# Patient Record
Sex: Male | Born: 1970 | Race: White | Hispanic: No | Marital: Married | Smoking: Never smoker
Health system: Southern US, Community
[De-identification: ages and names within clinical notes are randomized; demographics above are authoritative.]

---

## 2000-12-05 ENCOUNTER — Emergency Department (HOSPITAL_COMMUNITY): Admission: EM | Admit: 2000-12-05 | Discharge: 2000-12-05 | Payer: Self-pay | Admitting: Emergency Medicine

## 2000-12-06 ENCOUNTER — Encounter: Payer: Self-pay | Admitting: Emergency Medicine

## 2015-03-18 ENCOUNTER — Encounter (HOSPITAL_BASED_OUTPATIENT_CLINIC_OR_DEPARTMENT_OTHER): Payer: Self-pay | Admitting: *Deleted

## 2015-03-18 ENCOUNTER — Emergency Department (HOSPITAL_BASED_OUTPATIENT_CLINIC_OR_DEPARTMENT_OTHER): Payer: BLUE CROSS/BLUE SHIELD

## 2015-03-18 ENCOUNTER — Emergency Department (HOSPITAL_BASED_OUTPATIENT_CLINIC_OR_DEPARTMENT_OTHER)
Admission: EM | Admit: 2015-03-18 | Discharge: 2015-03-18 | Disposition: A | Discharge status: Home / Self Care | Payer: BLUE CROSS/BLUE SHIELD | Attending: Emergency Medicine | Admitting: Emergency Medicine

## 2015-03-18 DIAGNOSIS — K122 Cellulitis and abscess of mouth: Secondary | ICD-10-CM | POA: Diagnosis not present

## 2015-03-18 DIAGNOSIS — M5382 Other specified dorsopathies, cervical region: Secondary | ICD-10-CM | POA: Diagnosis not present

## 2015-03-18 DIAGNOSIS — R221 Localized swelling, mass and lump, neck: Secondary | ICD-10-CM

## 2015-03-18 DIAGNOSIS — R1310 Dysphagia, unspecified: Secondary | ICD-10-CM | POA: Diagnosis present

## 2015-03-18 MED ORDER — PREDNISONE 10 MG PO TABS: 40.0000 mg | ORAL_TABLET | Freq: Every day | ORAL | Status: AC

## 2015-03-18 MED ORDER — PREDNISONE 20 MG PO TABS
40.0000 mg | ORAL_TABLET | Freq: Once | ORAL | Status: AC
  Administered 2015-03-18: 40 mg via ORAL
  Filled 2015-03-18: qty 2

## 2015-03-18 MED ORDER — IOHEXOL 300 MG/ML  SOLN
75.0000 mL | Freq: Once | INTRAMUSCULAR | Status: AC | PRN
  Administered 2015-03-18: 75 mL via INTRAVENOUS

## 2015-03-18 NOTE — ED Notes (Signed)
States his throat is swelling, no changes in speech, no difficulty swallowing, denies any fever

## 2015-03-18 NOTE — ED Notes (Signed)
Pt states that this issue resolves itself after a few hours.

## 2015-03-18 NOTE — ED Notes (Signed)
Patient transported to CT 

## 2015-03-18 NOTE — ED Provider Notes (Signed)
CSN: [id number]     Arrival date & time 03/18/15  5784 History   First MD Initiated Contact with Patient 03/18/15 [phone]     No chief complaint on file.  Chief complaint fullness in throat  (Consider location/radiation/quality/duration/timing/severity/associated sxs/prior Treatment) HPI Patient awakened this morning with feeling of his throat closing. His wife states that his voice is slightly different. He denies fever denies pain anywhere denies dyspnea. Patient is afraid he has sleep apnea his wife states he snores a lot. He's had similar symptoms intermittently for the past 6 months. He awakens with feeling of his throat closing and symptoms improved spontaneously throughout the day without treatment. This episode more severe than past episodes. Nothing makes symptoms better or worse. No other associated symptoms. No past medical history on file. No past surgical history on file. No family history on file. History  Substance Use Topics  . Smoking status: Not on file  . Smokeless tobacco: Not on file  . Alcohol Use: Not on file   no tobacco no alcohol no drugs  Review of Systems  Constitutional: Negative.   HENT: Positive for voice change.        Fullness in throat  Respiratory: Negative.   Cardiovascular: Negative.   Gastrointestinal: Negative.   Musculoskeletal: Negative.   Skin: Negative.   Allergic/Immunologic: Negative.   Neurological: Negative.   Psychiatric/Behavioral: Negative.   All other systems reviewed and are negative.     Allergies  Review of patient's allergies indicates not on file.  Home Medications   Prior to Admission medications   Not on File   BP 124/90 mmHg  Pulse 66  Temp(Src) 98 F (36.7 C) (Oral)  Resp 18  Ht 6' (1.829 m)  Wt 205 lb (92.987 kg)  BMI 27.80 kg/m2  SpO2 97% Physical Exam  Constitutional: He appears well-developed and well-nourished.  HENT:  Head: Normocephalic and atraumatic.  Mouth/Throat: No oropharyngeal exudate.   Tonsils large uvula long. No oral pharyngeal exudate. Uvula midline. Handling secretions well. No trismus  Eyes: Conjunctivae are normal. Pupils are equal, round, and reactive to light.  Neck: Neck supple. No tracheal deviation present. No thyromegaly present.  Cardiovascular: Normal rate and regular rhythm.   No murmur heard. Pulmonary/Chest: Effort normal and breath sounds normal.  Abdominal: Soft. Bowel sounds are normal. He exhibits no distension. There is no tenderness.  Musculoskeletal: Normal range of motion. He exhibits no edema or tenderness.  Lymphadenopathy:    He has no cervical adenopathy.  Neurological: He is alert. Coordination normal.  Skin: Skin is warm and dry. No rash noted.  Psychiatric: He has a normal mood and affect.  Nursing note and vitals reviewed.   ED Course  Procedures (including critical care time) Labs Review Labs Reviewed - No data to display  Imaging Review No results found.   EKG Interpretation None     8 AM patient resting comfortably. No distress. No results found for this or any previous visit. Ct Soft Tissue Neck W Contrast  03/18/2015   CLINICAL DATA:  Anterior throat fullness for 1 month with dysphagia. No fever or difficulty swallowing. Initial encounter.  EXAM: CT NECK WITH CONTRAST  TECHNIQUE: Multidetector CT imaging of the neck was performed using the standard protocol following the bolus administration of intravenous contrast.  CONTRAST:  75mL OMNIPAQUE IOHEXOL 300 MG/ML  SOLN  COMPARISON:  None.  FINDINGS: Pharynx and larynx: There is minimal symmetric prominence of the palatine tonsils without associated low-density or infiltration of the surrounding  fat planes. The epiglottis and prevertebral soft tissues appear normal. No laryngeal abnormality identified. No evidence of airway compromise.  Salivary glands: Symmetric in size without focal abnormality.  Thyroid: Unremarkable.  Lymph nodes: Small cervical lymph nodes bilaterally are not  pathologically enlarged.  Vascular: No significant atherosclerosis. No evidence of large vessel occlusion.  Limited intracranial: Unremarkable.  Visualized orbits: Unremarkable.  Mastoids and visualized paranasal sinuses: Clear.  Skeleton: Unremarkable.  Upper chest: The lung apices are clear. The superior mediastinum appears unremarkable aside from a probable small amount of residual thymic tissue in the pre-vascular space.  IMPRESSION: No acute findings or explanation for the patient's symptoms. No evidence of airway compromise.   Electronically Signed   By: Carey Bullocks M.D.   On: 03/18/2015 07:44    MDM  Patient is concerned about sleep apnea. His brother and father suffer from sleep apnea. His wife states he snores a lot. He does not wake up feeling fresh. I suggested contact his primary care physician for sleep study. He'll also be referred to ENT. Clinically patient has uvulitis. Doubt infectious etiology. He does suffer from globus sensation Plan prescription prednisone Diagnosis uvulitis Final diagnoses:  None        Doug Sou, MD 03/18/15 [phone]

## 2015-03-18 NOTE — ED Notes (Signed)
MD at bedside. 

## 2015-03-18 NOTE — Discharge Instructions (Signed)
Take the prednisone as prescribed starting tomorrow morning. Contact your primary care physician to inquire about sleep study. Call Dr. Jenne Pane to schedule an office appointment regarding the fullness in your throat. Or you can ask your primary care physician for referral to an ENT specialist. Return if you feel worse for any reason.

## 2015-03-18 NOTE — ED Notes (Signed)
Presents with "difficulty swallowing", states he has awaken before with throat swelling

## 2016-02-27 ENCOUNTER — Encounter: Payer: Self-pay | Admitting: *Deleted

## 2016-02-27 ENCOUNTER — Emergency Department
Admission: EM | Admit: 2016-02-27 | Discharge: 2016-02-27 | Disposition: A | Discharge status: Home / Self Care | Payer: BLUE CROSS/BLUE SHIELD | Attending: Emergency Medicine | Admitting: Emergency Medicine

## 2016-02-27 ENCOUNTER — Emergency Department: Payer: BLUE CROSS/BLUE SHIELD

## 2016-02-27 DIAGNOSIS — Z7952 Long term (current) use of systemic steroids: Secondary | ICD-10-CM | POA: Insufficient documentation

## 2016-02-27 DIAGNOSIS — F0781 Postconcussional syndrome: Secondary | ICD-10-CM | POA: Diagnosis not present

## 2016-02-27 DIAGNOSIS — R42 Dizziness and giddiness: Secondary | ICD-10-CM | POA: Insufficient documentation

## 2016-02-27 MED ORDER — MECLIZINE HCL 25 MG PO TABS: 25.0000 mg | ORAL_TABLET | Freq: Three times a day (TID) | ORAL | Status: AC | PRN

## 2016-02-27 NOTE — ED Provider Notes (Signed)
[facility] Emergency Department Provider Note  ____________________________________________  Time seen: Approximately 12:25 PM  I have reviewed the triage vital signs and the nursing notes.   HISTORY  Chief Complaint Dizziness   HPI Roger Trujillo is a 45 y.o. male is here with complaint of dizziness. Patient states that he was hit in the back of the head with a softball 2 nights ago. Patient states there was no loss of consciousness and there has been no nausea, vomiting, or visual changes. Patient states there were no symptoms until yesterday when he had a very short episode of dizziness. Patient states today while at work he became lightheaded and dizzy which made him more concerned that he may have head injury. He continues to deny headache, visual changes, nausea or vomiting. Currently patient rates his pain as a 2 out of 10.   History reviewed. No pertinent past medical history.  There are no active problems to display for this patient.   History reviewed. No pertinent past surgical history.  Current Outpatient Rx  Name  Route  Sig  Dispense  Refill  . meclizine (ANTIVERT) 25 MG tablet   Oral   Take 1 tablet (25 mg total) by mouth 3 (three) times daily as needed for dizziness.   12 tablet   0   . predniSONE (DELTASONE) 10 MG tablet   Oral   Take 4 tablets (40 mg total) by mouth daily. Take 4 tablets each morning starting 03/19/2015   16 tablet   0     Allergies Review of patient's allergies indicates no known allergies.  History reviewed. No pertinent family history.  Social History Social History  Substance Use Topics  . Smoking status: Never Smoker   . Smokeless tobacco: None  . Alcohol Use: No    Review of Systems Constitutional: No fever/chills Eyes: No visual changes. ENT: No trauma Cardiovascular: Denies chest pain. Respiratory: Denies shortness of breath. Gastrointestinal:  No nausea, no vomiting.     Musculoskeletal: Negative for back pain. Skin: Negative for rash. Neurological: Negative for headaches, focal weakness or numbness. Positive for dizziness  10-point ROS otherwise negative.  ____________________________________________   PHYSICAL EXAM:  VITAL SIGNS: ED Triage Vitals  Enc Vitals Group     BP 02/27/16 1112 122/86 mmHg     Pulse Rate 02/27/16 1112 71     Resp 02/27/16 1112 18     Temp 02/27/16 1112 98.6 F (37 C)     Temp Source 02/27/16 1112 Oral     SpO2 02/27/16 1112 96 %     Weight 02/27/16 1112 210 lb (95.255 kg)     Height 02/27/16 1112 6' (1.829 m)     Head Cir --      Peak Flow --      Pain Score 02/27/16 1119 2     Pain Loc --      Pain Edu? --      Excl. in GC? --     Constitutional: Alert and oriented. Well appearing and in no acute distress. Eyes: Conjunctivae are normal. PERRL. EOMI.  Head: Tenderness on palpation left posterior scalp without skin abrasion or swelling. Nose: No congestion/rhinnorhea. Neck: No stridor.  Nontender cervical spine on palpation. Hematological/Lymphatic/Immunilogical: No cervical lymphadenopathy. Cardiovascular: Normal rate, regular rhythm. Grossly normal heart sounds.  Good peripheral circulation. Respiratory: Normal respiratory effort.  No retractions. Lungs CTAB. Musculoskeletal: No lower extremity tenderness nor edema.  No joint effusions. Neurologic:  Normal speech and language. No  gross focal neurologic deficits are appreciated. No gait instability. Cranial nerves II through XII grossly intact. Normal gait was noted Skin:  Skin is warm, dry and intact. No rash noted. No abrasions, ecchymosis or erythema was noted. Psychiatric: Mood and affect are normal. Speech and behavior are normal.  ____________________________________________   LABS (all labs ordered are listed, but only abnormal results are displayed)  Labs Reviewed - No data to display   RADIOLOGY  CT scan head per radiologist is negative for  acute intracranial findings. ____________________________________________   PROCEDURES  Procedure(s) performed: None  Procedures  Critical Care performed: No  ____________________________________________   INITIAL IMPRESSION / ASSESSMENT AND PLAN / ED COURSE  Pertinent labs & imaging results that were available during my care of the patient were reviewed by me and considered in my medical decision making (see chart for details).  Patient was given a prescription for Antivert 25 mg one 3 times a day if needed for dizziness. Patient states that he prefers not to take any medication at this time that took the prescription just in case dizziness continued. He is to follow-up with his primary care doctor if any continued problems. ____________________________________________   FINAL CLINICAL IMPRESSION(S) / ED DIAGNOSES  Final diagnoses:  Post-concussion vertigo      NEW MEDICATIONS STARTED DURING THIS VISIT:  Discharge Medication List as of 02/27/2016 12:35 PM       Note:  This document was prepared using Dragon voice recognition software and may include unintentional dictation errors.    Tommi Rumps, PA-C 02/27/16 1752  Emily Filbert, MD 02/28/16 [phone]

## 2016-02-27 NOTE — Discharge Instructions (Signed)
Antivert as needed for dizziness. Follow-up with your primary care doctor if any continued problems. Tylenol if needed for headache.

## 2016-02-27 NOTE — ED Notes (Signed)
States Monday night he got hit in the head with a softball, states this AM he felt dizzy and lightheaded, denies any LOC, not on any blood thinners, pt awake and alert in no acute distress

## 2016-02-27 NOTE — ED Notes (Signed)
Pt alert and oriented X4, active, cooperative, pt in NAD. RR even and unlabored, color WNL.  Pt informed to return if any life threatening symptoms occur.   

## 2017-09-22 IMAGING — CT CT HEAD W/O CM
3 series · 16 of 47 positions shown, 19 images · non-contrast
Comparison: None.

CLINICAL DATA: Was hit in the left posterior aspect of the head
with a softball 2 days ago. Now feeling lightheaded/dizzy.

EXAM:
CT HEAD WITHOUT CONTRAST
TECHNIQUE: Contiguous axial images were obtained from the base of the skull
through the vertex without intravenous contrast.

[Series 2: head wo · axial · 0.42mm/px · z∈[+57,+182]mm · 10 of 31 slices shown, 13 images]
[im 3/31  brain]
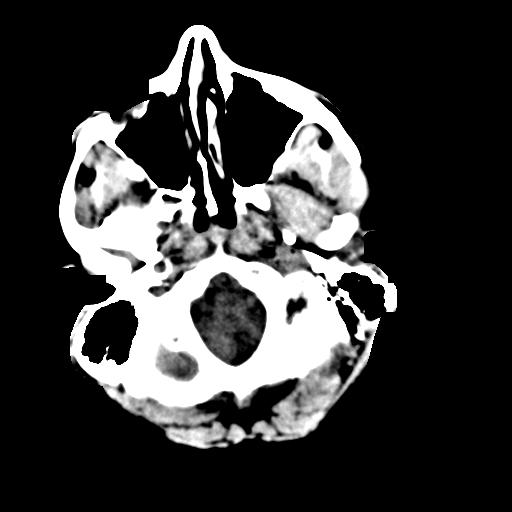
[im 3/31  bone]
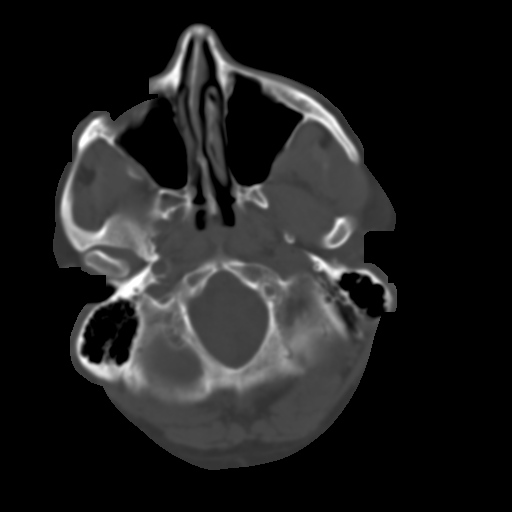
[im 6/31  brain]
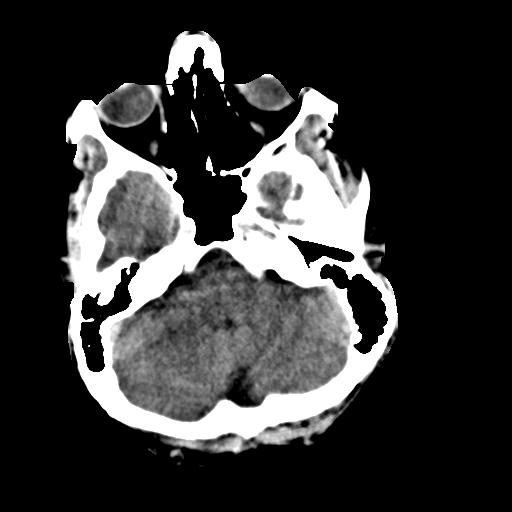
[im 9/31  brain]
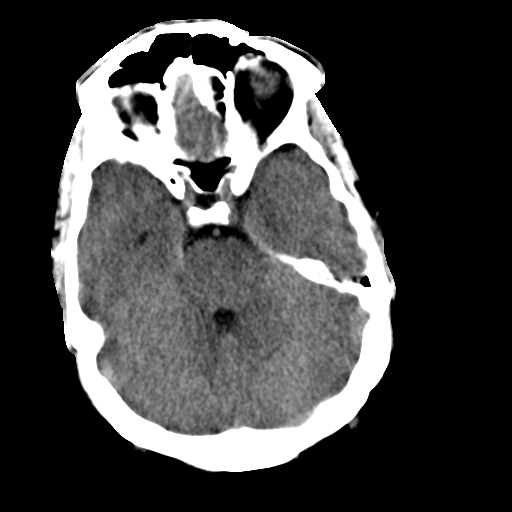
[im 11/31  brain]
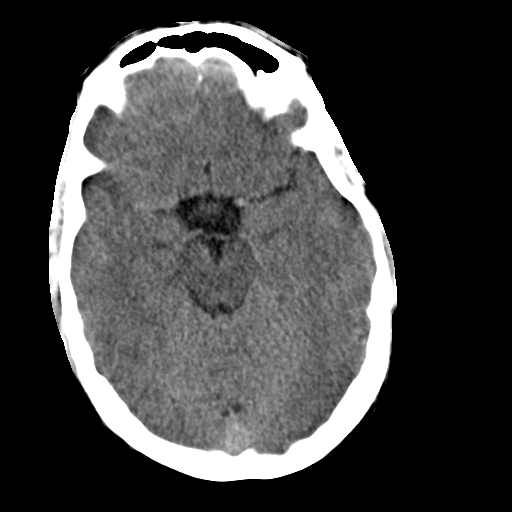
[im 14/31  brain]
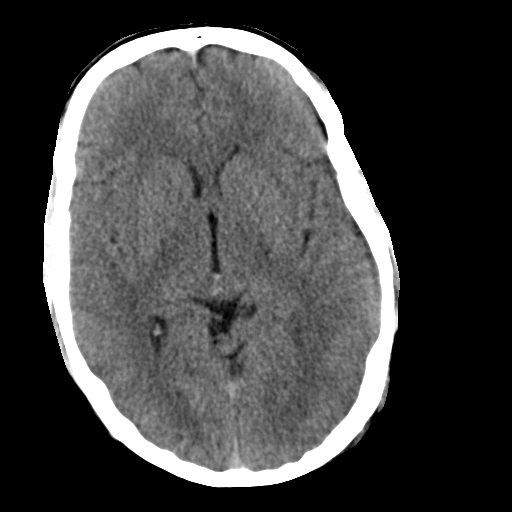
[im 14/31  bone]
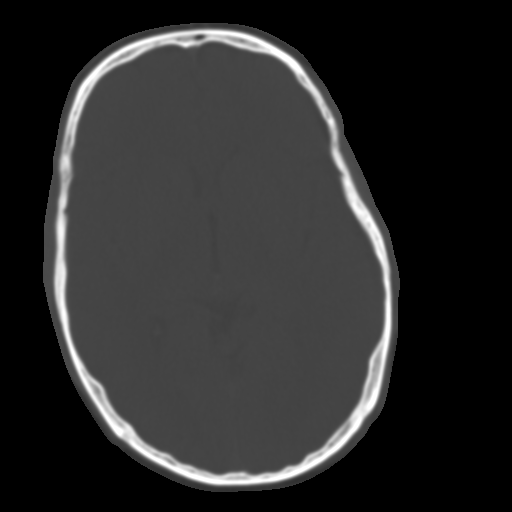
[im 17/31  brain]
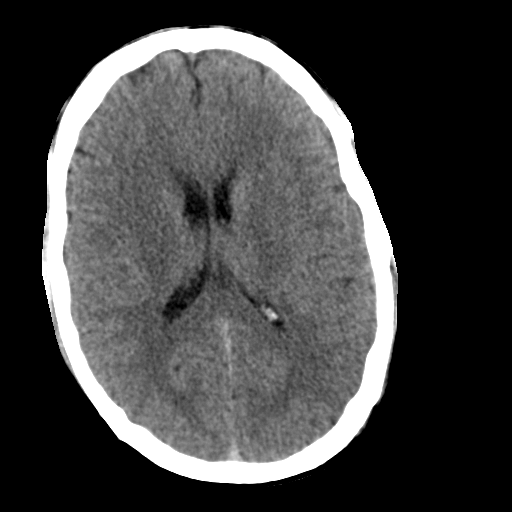
[im 20/31  brain]
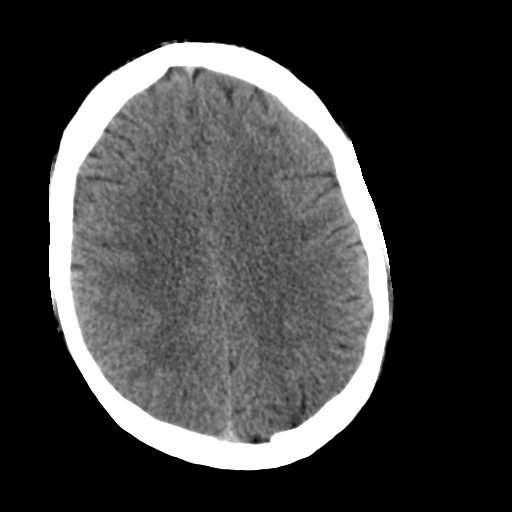
[im 23/31  brain]
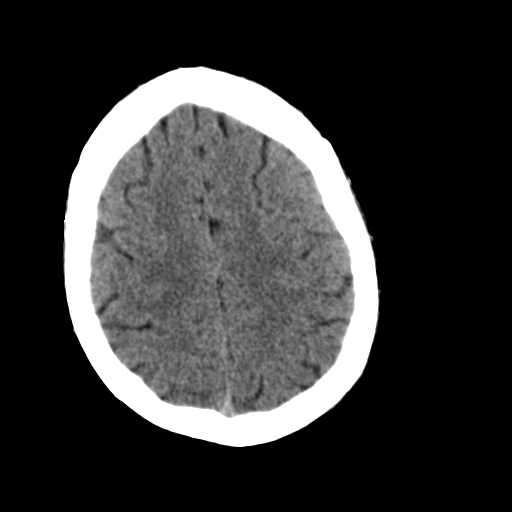
[im 25/31  brain]
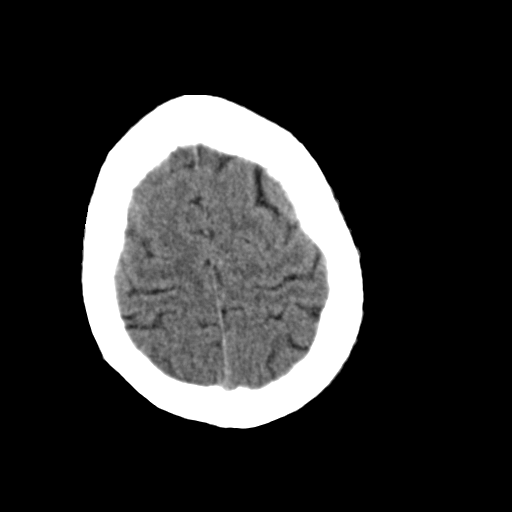
[im 25/31  bone]
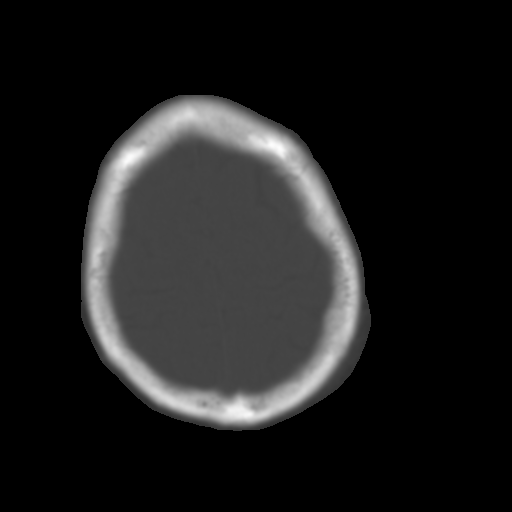
[im 28/31  brain]
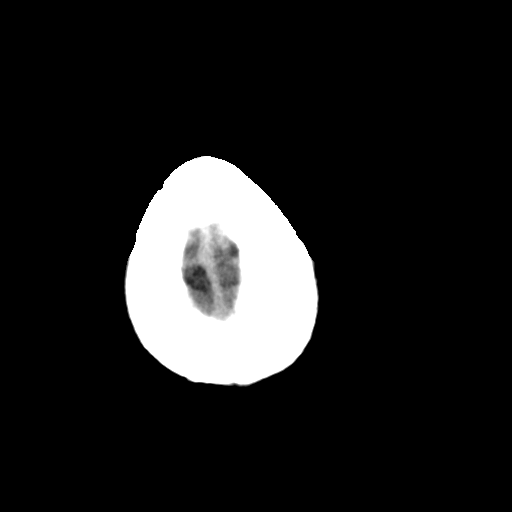

[Series 4: coronal soft tissue · coronal · 0.30mm/px · 3 of 67 slices shown]
[im 23/67  brain]
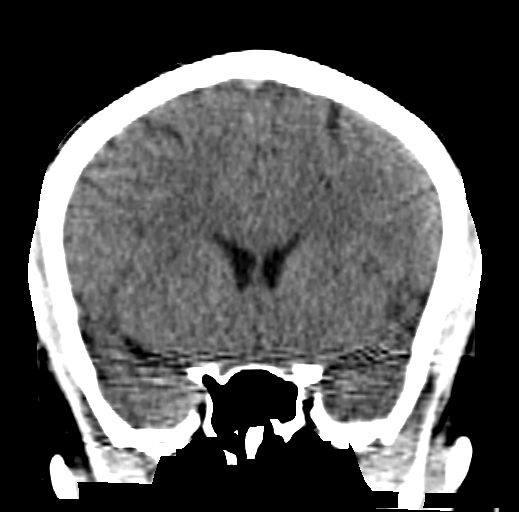
[im 30/67  brain]
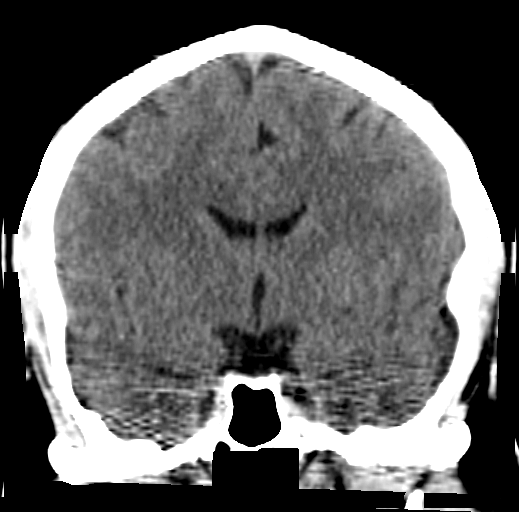
[im 37/67  brain]
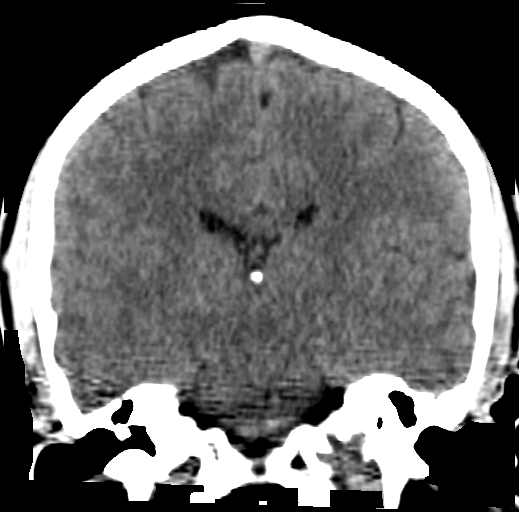

[Series 5: sagittal soft tissue · sagittal · 0.30mm/px · 3 of 53 slices shown]
[im 18/53  brain]
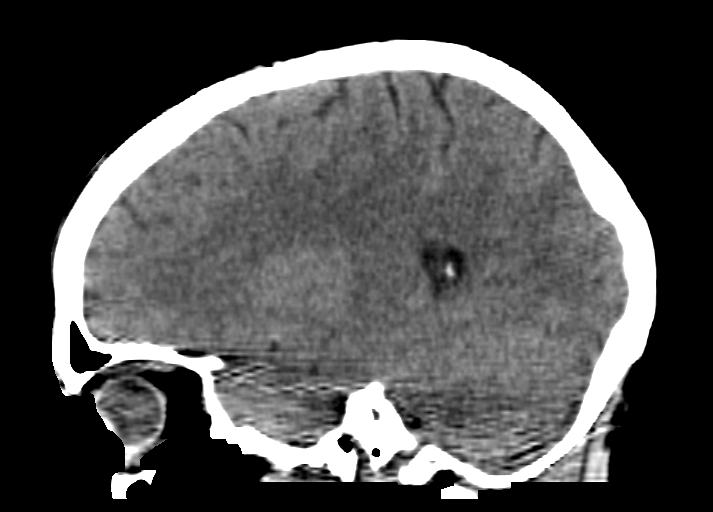
[im 27/53  brain]
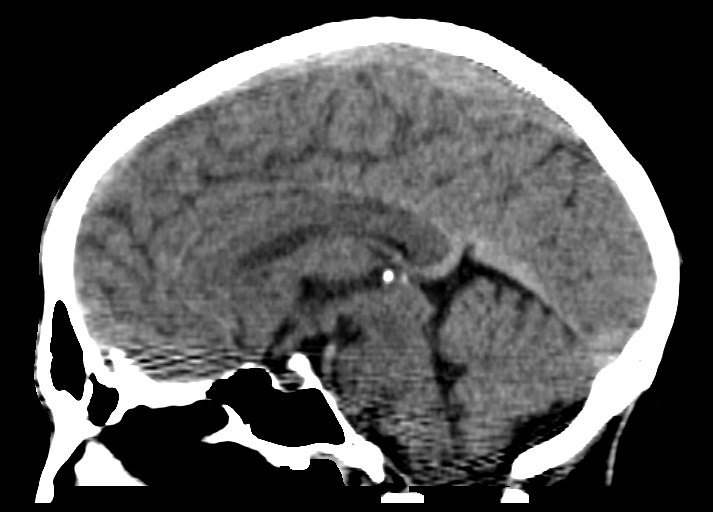
[im 35/53  brain]
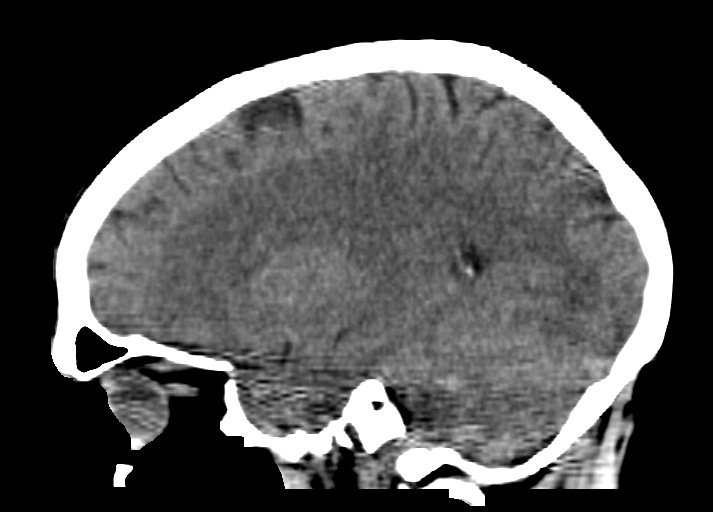

[16 of 47 positions shown; findings below may reference images not displayed]

FINDINGS: Brain: No evidence of acute infarction, hemorrhage, extra-axial
collection, ventriculomegaly, or mass effect.

Vascular: No hyperdense vessel or unexpected calcification.

Skull: Negative for fracture or focal lesion.

Sinuses/Orbits: No acute findings.

Other: None.
IMPRESSION: No acute intracranial pathology.
